# Patient Record
Sex: Female | Born: 2005
Health system: Southern US, Community
[De-identification: ages and names within clinical notes are randomized; demographics above are authoritative.]

## PROBLEM LIST (undated history)

## (undated) DIAGNOSIS — D571 Sickle-cell disease without crisis: Secondary | ICD-10-CM

## (undated) DIAGNOSIS — D5701 Hb-SS disease with acute chest syndrome: Secondary | ICD-10-CM

## (undated) HISTORY — PX: TONSILLECTOMY AND ADENOIDECTOMY: SHX28

## (undated) HISTORY — PX: CHOLECYSTECTOMY, LAPAROSCOPIC: SHX56

---
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
## 2024-02-02 ENCOUNTER — Encounter (HOSPITAL_COMMUNITY): Payer: Self-pay | Admitting: Emergency Medicine

## 2024-02-02 ENCOUNTER — Emergency Department (HOSPITAL_COMMUNITY)

## 2024-02-02 ENCOUNTER — Emergency Department (HOSPITAL_COMMUNITY)
Admission: EM | Admit: 2024-02-02 | Discharge: 2024-02-03 | Disposition: A | Discharge status: Home / Self Care | Attending: Emergency Medicine | Admitting: Emergency Medicine

## 2024-02-02 ENCOUNTER — Other Ambulatory Visit: Payer: Self-pay

## 2024-02-02 DIAGNOSIS — D5700 Hb-SS disease with crisis, unspecified: Secondary | ICD-10-CM | POA: Diagnosis present

## 2024-02-02 LAB — CBC WITH DIFFERENTIAL/PLATELET
Abs Granulocyte: 10.2 K/uL — ABNORMAL HIGH (ref 1.5–6.5)
Abs Immature Granulocytes: 0.1 K/uL — ABNORMAL HIGH (ref 0.00–0.07)
Basophils Absolute: 0.1 K/uL (ref 0.0–0.1)
Basophils Relative: 1 %
Eosinophils Absolute: 0.2 K/uL (ref 0.0–0.5)
Eosinophils Relative: 1 %
HCT: 21.3 % — ABNORMAL LOW (ref 36.0–46.0)
Hemoglobin: 7.8 g/dL — ABNORMAL LOW (ref 12.0–15.0)
Immature Granulocytes: 1 %
Lymphocytes Relative: 27 %
Lymphs Abs: 4.4 K/uL — ABNORMAL HIGH (ref 0.7–4.0)
MCH: 30.7 pg (ref 26.0–34.0)
MCHC: 36.6 g/dL — ABNORMAL HIGH (ref 30.0–36.0)
MCV: 83.9 fL (ref 80.0–100.0)
Monocytes Absolute: 1.7 K/uL — ABNORMAL HIGH (ref 0.1–1.0)
Monocytes Relative: 10 %
Neutro Abs: 10.2 K/uL — ABNORMAL HIGH (ref 1.7–7.7)
Neutrophils Relative %: 60 %
Platelets: 460 K/uL — ABNORMAL HIGH (ref 150–400)
RBC: 2.54 MIL/uL — ABNORMAL LOW (ref 3.87–5.11)
RDW: 23.5 % — ABNORMAL HIGH (ref 11.5–15.5)
Smear Review: NORMAL
WBC: 16.7 K/uL — ABNORMAL HIGH (ref 4.0–10.5)
nRBC: 0.8 % — ABNORMAL HIGH (ref 0.0–0.2)

## 2024-02-02 LAB — COMPREHENSIVE METABOLIC PANEL WITH GFR
ALT: 18 U/L (ref 0–44)
AST: 34 U/L (ref 15–41)
Albumin: 4.7 g/dL (ref 3.5–5.0)
Alkaline Phosphatase: 51 U/L (ref 38–126)
Anion gap: 10 (ref 5–15)
BUN: 9 mg/dL (ref 6–20)
CO2: 20 mmol/L — ABNORMAL LOW (ref 22–32)
Calcium: 9.2 mg/dL (ref 8.9–10.3)
Chloride: 106 mmol/L (ref 98–111)
Creatinine, Ser: 0.48 mg/dL (ref 0.44–1.00)
GFR, Estimated: >60 mL/min (ref >60)
Glucose, Bld: 85 mg/dL (ref 70–99)
Potassium: 3.7 mmol/L (ref 3.5–5.1)
Sodium: 136 mmol/L (ref 135–145)
Total Bilirubin: 3.7 mg/dL — ABNORMAL HIGH (ref 0.0–1.2)
Total Protein: 8.4 g/dL — ABNORMAL HIGH (ref 6.5–8.1)

## 2024-02-02 LAB — RETICULOCYTES
Immature Retic Fract: 44.6 % — ABNORMAL HIGH (ref 2.3–15.9)
RBC.: 2.52 MIL/uL — ABNORMAL LOW (ref 3.87–5.11)
Retic Count, Absolute: 278.2 K/uL — ABNORMAL HIGH (ref 19.0–186.0)
Retic Ct Pct: 11 % — ABNORMAL HIGH (ref 0.4–3.1)

## 2024-02-02 LAB — HCG, SERUM, QUALITATIVE: Preg, Serum: NEGATIVE

## 2024-02-02 MED ORDER — KETOROLAC TROMETHAMINE 30 MG/ML IJ SOLN
30.0000 mg | Freq: Once | INTRAMUSCULAR | Status: AC
  Administered 2024-02-03: 30 mg via INTRAVENOUS
  Filled 2024-02-02: qty 1

## 2024-02-02 MED ORDER — HYDROMORPHONE HCL 1 MG/ML IJ SOLN
1.0000 mg | Freq: Once | INTRAMUSCULAR | Status: AC
  Administered 2024-02-02: 1 mg via INTRAVENOUS

## 2024-02-02 MED ORDER — DIPHENHYDRAMINE HCL 25 MG PO CAPS
25.0000 mg | ORAL_CAPSULE | Freq: Once | ORAL | Status: AC
  Administered 2024-02-02: 25 mg via ORAL
  Filled 2024-02-02: qty 1

## 2024-02-02 MED ORDER — MORPHINE SULFATE (PF) 4 MG/ML IV SOLN
6.2000 mg | Freq: Once | INTRAVENOUS | Status: AC
  Administered 2024-02-03: 6.2 mg via INTRAVENOUS
  Filled 2024-02-02: qty 2

## 2024-02-02 MED ORDER — HYDROMORPHONE HCL 1 MG/ML IJ SOLN
1.0000 mg | Freq: Once | INTRAMUSCULAR | Status: DC
  Filled 2024-02-02: qty 1

## 2024-02-02 MED ORDER — MORPHINE SULFATE (PF) 4 MG/ML IV SOLN
0.1000 mg/kg | Freq: Once | INTRAVENOUS | Status: AC
  Administered 2024-02-02: 6.2 mg via INTRAVENOUS
  Filled 2024-02-02: qty 2

## 2024-02-02 NOTE — ED Provider Notes (Signed)
 [place] EMERGENCY DEPARTMENT AT [facility] Provider Note   CSN: [id number] Arrival date & time: 02/02/24  2044     Patient presents with: Sickle Cell Pain Crisis   Jamie Vasquez is a 18 y.o. female.  {Add pertinent medical, surgical, social history, OB history to HPI:32947} HPI      On and off for 2 weeks, takes medicine and better  When stand or active will have dyspnea 2 weeks Having chest pain too, will have both symptoms with crises in the past No cough or fever No nausea or vomiting  No leg swelling , no leg pain  Back, legs, arms typically Chest pain with sickel cell, feels tight    Past Medical History:  Diagnosis Date   Acute chest syndrome due to sickle cell crisis (HCC)    Sickle cell anemia (HCC)     Prior to Admission medications   Medication Sig Start Date End Date Taking? Authorizing Provider  acetaminophen  (TYLENOL ) 325 MG tablet Take 2 tablets (650 mg total) by mouth every 6 (six) hours. Patient not taking: Reported on 01/12/2023 08/03/21   Awadalla, Menna, MD  cholecalciferol  (VITAMIN D) 25 MCG (1000 UNIT) tablet Take 1,000 Units by mouth daily. Patient not taking: Reported on 01/12/2023 10/18/21   [provider]  hydroxyurea  (HYDREA ) 500 MG capsule Take 1,500 mg by mouth daily. 11/18/21   [provider]  ibuprofen  (ADVIL ) 600 MG tablet Take 1 tablet (600 mg total) by mouth every 6 (six) hours. Patient not taking: Reported on 01/12/2023 08/03/21   Awadalla, Menna, MD  oxyCODONE  (OXY IR/ROXICODONE ) 5 MG immediate release tablet Take 1 tablet (5 mg total) by mouth every 4 (four) hours as needed for moderate pain (pain score 4-6), severe pain (pain score 7-10) or breakthrough pain. 05/02/23   Eilleen Colander, NP  polyethylene glycol (MIRALAX  / GLYCOLAX ) 17 g packet Take 34 g by mouth 2 (two) times daily. Patient not taking: Reported on 01/12/2023 08/03/21   Awadalla, Menna, MD  senna (SENOKOT) 8.6 MG TABS tablet Take 2 tablets (17.2  mg total) by mouth daily. Patient not taking: Reported on 01/12/2023 08/04/21   Awadalla, Menna, MD    Allergies: Patient has no known allergies.    Review of Systems  Updated Vital Signs BP 115/69   Pulse 86   Temp 98.3 F (36.8 C)   Resp 18   Ht 5' 7 (1.702 m)   Wt 62 kg   LMP 01/19/2024   SpO2 96%   BMI 21.41 kg/m   Physical Exam  (all labs ordered are listed, but only abnormal results are displayed) Labs Reviewed  COMPREHENSIVE METABOLIC PANEL WITH GFR - Abnormal; Notable for the following components:      Result Value   CO2 20 (*)    Total Protein 8.4 (*)    Total Bilirubin 3.7 (*)    All other components within normal limits  CBC WITH DIFFERENTIAL/PLATELET - Abnormal; Notable for the following components:   WBC 16.7 (*)    RBC 2.54 (*)    Hemoglobin 7.8 (*)    HCT 21.3 (*)    MCHC 36.6 (*)    RDW 23.5 (*)    Platelets 460 (*)    nRBC 0.8 (*)    Neutro Abs 10.2 (*)    Lymphs Abs 4.4 (*)    Monocytes Absolute 1.7 (*)    Abs Immature Granulocytes 0.10 (*)    Abs Granulocyte 10.2 (*)    All other components within  normal limits  RETICULOCYTES - Abnormal; Notable for the following components:   Retic Ct Pct 11.0 (*)    RBC. 2.52 (*)    Retic Count, Absolute 278.2 (*)    Immature Retic Fract 44.6 (*)    All other components within normal limits  HCG, SERUM, QUALITATIVE    EKG: None  Radiology: DG Chest 2 View Result Date: 02/02/2024 CLINICAL DATA:  Chest pain and sickle cell crisis EXAM: CHEST - 2 VIEW COMPARISON:  05/02/2023 FINDINGS: The heart size and mediastinal contours are within normal limits. Both lungs are clear. The visualized skeletal structures are unremarkable. IMPRESSION: No active cardiopulmonary disease. Electronically Signed   By: Oneil Devonshire M.D.   On: 02/02/2024 21:30    {Document cardiac monitor, telemetry assessment procedure when appropriate:32947} Procedures   Medications Ordered in the ED  ketorolac  (TORADOL ) 30 MG/ML injection  30 mg (has no administration in time range)  morphine  (PF) 4 MG/ML injection 6.2 mg (has no administration in time range)  HYDROmorphone  (DILAUDID ) injection 1 mg (1 mg Intravenous Given 02/02/24 2120)  diphenhydrAMINE  (BENADRYL ) capsule 25 mg (25 mg Oral Given 02/02/24 2212)  morphine  (PF) 4 MG/ML injection 6.2 mg (6.2 mg Intravenous Given 02/02/24 2209)      {Click here for ABCD2, HEART and other calculators REFRESH Note before signing:1}                              Medical Decision Making Risk Prescription drug management.   ***  {Document critical care time when appropriate  Document review of labs and clinical decision tools ie CHADS2VASC2, etc  Document your independent review of radiology images and any outside records  Document your discussion with family members, caretakers and with consultants  Document social determinants of health affecting pt's care  Document your decision making why or why not admission, treatments were needed:32947:::1}   Final diagnoses:  None    ED Discharge Orders     None

## 2024-02-02 NOTE — ED Triage Notes (Signed)
 Patient c/o sickle cell crisis pain x 3 weeks. Patient report taking PRN pain medication with minimal relief. Patient report worsening chest pain tonight. Patient report nausea, denies vomiting.

## 2024-02-02 NOTE — ED Provider Triage Note (Signed)
 Emergency Medicine Provider Triage Evaluation Note  Jamie Vasquez , a 18 y.o. female  was evaluated in triage.  Pt complains of sickle cell crisis pain. Patient states that last crisis she was in the hospital for was last October. States that she does have chest pain and some shortness of breath. Patient states she has been out of home oxycodone  for some time. Denies fever, chills, abdominal pain, nausea, vomiting.   Review of Systems  Positive: Chest pain, shortness of breath Negative: Fever, chills   Physical Exam  There were no vitals taken for this visit. Gen:   Awake, no distress   Resp:  Normal effort  MSK:   Moves extremities without difficulty  Other:  Talking in full sentences on room air  Medical Decision Making  Medically screening exam initiated at 8:52 PM.  Appropriate orders placed.  Jamie Vasquez was informed that the remainder of the evaluation will be completed by another provider, this initial triage assessment does not replace that evaluation, and the importance of remaining in the ED until their evaluation is complete.  Orders: cbc, cmp, serum pregnancy, reticulocytes, CXR, EKG   Jamie Vasquez, [place] 02/02/24 2102

## 2024-02-03 MED ORDER — OXYCODONE HCL 5 MG PO TABS: 5.0000 mg | ORAL_TABLET | ORAL | 0 refills | Status: AC | PRN

## 2024-04-27 ENCOUNTER — Other Ambulatory Visit: Payer: Self-pay | Admitting: Pediatrics

## 2024-04-27 DIAGNOSIS — Z207 Contact with and (suspected) exposure to pediculosis, acariasis and other infestations: Secondary | ICD-10-CM

## 2024-04-27 MED ORDER — PERMETHRIN 5 % EX CREA: 1.0000 | TOPICAL_CREAM | Freq: Once | CUTANEOUS | 0 refills | Status: AC

## 2024-04-27 NOTE — Progress Notes (Signed)
 Patient's household contact was seen today in clinic and is being treated for scabies.  Rx sent for patient to treat household contacts at the same time   Mallie Glendia Shorts, MD

## 2024-07-27 ENCOUNTER — Encounter (HOSPITAL_COMMUNITY): Payer: Self-pay

## 2024-07-27 ENCOUNTER — Other Ambulatory Visit: Payer: Self-pay

## 2024-07-27 ENCOUNTER — Emergency Department (HOSPITAL_COMMUNITY)
Admission: EM | Admit: 2024-07-27 | Discharge: 2024-07-27 | Discharge status: Against Medical Advice | Payer: Self-pay | Attending: Emergency Medicine | Admitting: Emergency Medicine

## 2024-07-27 DIAGNOSIS — Z5321 Procedure and treatment not carried out due to patient leaving prior to being seen by health care provider: Secondary | ICD-10-CM | POA: Insufficient documentation

## 2024-07-27 DIAGNOSIS — D57219 Sickle-cell/Hb-C disease with crisis, unspecified: Secondary | ICD-10-CM | POA: Insufficient documentation

## 2024-07-27 LAB — HCG, SERUM, QUALITATIVE: Preg, Serum: NEGATIVE

## 2024-07-27 LAB — CBC WITH DIFFERENTIAL/PLATELET
Abs Immature Granulocytes: 0.27 K/uL — ABNORMAL HIGH (ref 0.00–0.07)
Basophils Absolute: 0.1 K/uL (ref 0.0–0.1)
Basophils Relative: 0 %
Eosinophils Absolute: 0.3 K/uL (ref 0.0–0.5)
Eosinophils Relative: 2 %
HCT: 20 % — ABNORMAL LOW (ref 36.0–46.0)
Hemoglobin: 7.4 g/dL — ABNORMAL LOW (ref 12.0–15.0)
Immature Granulocytes: 2 %
Lymphocytes Relative: 27 %
Lymphs Abs: 4.1 K/uL — ABNORMAL HIGH (ref 0.7–4.0)
MCH: 32 pg (ref 26.0–34.0)
MCHC: 37 g/dL — ABNORMAL HIGH (ref 30.0–36.0)
MCV: 86.6 fL (ref 80.0–100.0)
Monocytes Absolute: 1.9 K/uL — ABNORMAL HIGH (ref 0.1–1.0)
Monocytes Relative: 13 %
Neutro Abs: 8.7 K/uL — ABNORMAL HIGH (ref 1.7–7.7)
Neutrophils Relative %: 56 %
Platelets: 432 K/uL — ABNORMAL HIGH (ref 150–400)
RBC: 2.31 MIL/uL — ABNORMAL LOW (ref 3.87–5.11)
RDW: 26.3 % — ABNORMAL HIGH (ref 11.5–15.5)
Smear Review: NORMAL
WBC: 15.3 K/uL — ABNORMAL HIGH (ref 4.0–10.5)
nRBC: 2.7 % — ABNORMAL HIGH (ref 0.0–0.2)

## 2024-07-27 LAB — COMPREHENSIVE METABOLIC PANEL WITH GFR
ALT: 20 U/L (ref 0–44)
AST: 39 U/L (ref 15–41)
Albumin: 4.6 g/dL (ref 3.5–5.0)
Alkaline Phosphatase: 69 U/L (ref 38–126)
Anion gap: 12 (ref 5–15)
BUN: 7 mg/dL (ref 6–20)
CO2: 23 mmol/L (ref 22–32)
Calcium: 9.1 mg/dL (ref 8.9–10.3)
Chloride: 104 mmol/L (ref 98–111)
Creatinine, Ser: 0.51 mg/dL (ref 0.44–1.00)
GFR, Estimated: >60 mL/min
Glucose, Bld: 92 mg/dL (ref 70–99)
Potassium: 3.8 mmol/L (ref 3.5–5.1)
Sodium: 139 mmol/L (ref 135–145)
Total Bilirubin: 3.3 mg/dL — ABNORMAL HIGH (ref 0.0–1.2)
Total Protein: 7.5 g/dL (ref 6.5–8.1)

## 2024-07-27 LAB — RETICULOCYTES
Immature Retic Fract: 47.4 % — ABNORMAL HIGH (ref 2.3–15.9)
RBC.: 2.31 MIL/uL — ABNORMAL LOW (ref 3.87–5.11)
Retic Count, Absolute: 402.9 K/uL — ABNORMAL HIGH (ref 19.0–186.0)
Retic Ct Pct: 17.4 % — ABNORMAL HIGH (ref 0.4–3.1)

## 2024-07-27 MED ORDER — OXYCODONE-ACETAMINOPHEN 5-325 MG PO TABS
1.0000 | ORAL_TABLET | Freq: Once | ORAL | Status: AC
  Administered 2024-07-27: 1 via ORAL
  Filled 2024-07-27: qty 1

## 2024-07-27 MED ORDER — IBUPROFEN 400 MG PO TABS
400.0000 mg | ORAL_TABLET | Freq: Once | ORAL | Status: AC
  Administered 2024-07-27: 400 mg via ORAL
  Filled 2024-07-27: qty 1

## 2024-07-27 NOTE — ED Triage Notes (Signed)
 C/O body aches since yesterday. Denies SHOB. Hx of sickle cell. Axox4. Denies n/v.

## 2024-07-27 NOTE — ED Provider Triage Note (Signed)
 Emergency Medicine Provider Triage Evaluation Note  Jamie Vasquez , a 19 y.o. female  was evaluated in triage.  Pt complains of hx sickle cell and c/o 'pain all over' c/w prior sickle cell pain. Has not taken any meds yet today for pain. No chest pain or sob. No cough or uri symptoms. No fever or chills.   Review of Systems  Positive: Generalized body pain Negative: fever  Physical Exam  BP 126/71 (BP Location: Right Arm)   Pulse 64   Temp (!) 97.4 F (36.3 C)   Resp 16   Ht 1.727 m (5' 8)   Wt 63.5 kg   LMP 07/21/2024 (Approximate)   SpO2 94%   BMI 21.29 kg/m  Gen:   Awake, no distress   Resp:  Normal effort cta bil.  MSK:   Moves extremities without difficulty no focal area of pain, swelling or tenderness noted.    Medical Decision Making  Medically screening exam initiated at 11:01 AM.  Appropriate orders placed.  Jamie Vasquez was informed that the remainder of the evaluation will be completed by another provider, this initial triage assessment does not replace that evaluation, and the importance of remaining in the ED until their evaluation is complete.  Labs ordered. Percocet po. Ibuprofen  po.    Jamie Drivers, MD 07/27/24 1102
